# Patient Record
Sex: Male | Born: 1945 | Race: Black or African American | Hispanic: No | Marital: Single | Smoking: Former smoker
Health system: Southern US, Community
[De-identification: ages and names within clinical notes are randomized; demographics above are authoritative.]

## PROBLEM LIST (undated history)

## (undated) DIAGNOSIS — I10 Essential (primary) hypertension: Secondary | ICD-10-CM

## (undated) DIAGNOSIS — K219 Gastro-esophageal reflux disease without esophagitis: Secondary | ICD-10-CM

## (undated) HISTORY — PX: JOINT REPLACEMENT: SHX530

---

## 2010-11-24 DIAGNOSIS — Z860100 Personal history of colon polyps, unspecified: Secondary | ICD-10-CM | POA: Insufficient documentation

## 2010-11-24 DIAGNOSIS — I10 Essential (primary) hypertension: Secondary | ICD-10-CM

## 2010-11-24 DIAGNOSIS — M179 Osteoarthritis of knee, unspecified: Secondary | ICD-10-CM

## 2010-11-24 DIAGNOSIS — R1310 Dysphagia, unspecified: Secondary | ICD-10-CM

## 2010-11-24 HISTORY — DX: Personal history of colon polyps, unspecified: Z86.0100

## 2010-11-24 HISTORY — DX: Dysphagia, unspecified: R13.10

## 2010-11-24 HISTORY — DX: Essential (primary) hypertension: I10

## 2010-11-24 HISTORY — DX: Osteoarthritis of knee, unspecified: M17.9

## 2010-12-31 DIAGNOSIS — Z8711 Personal history of peptic ulcer disease: Secondary | ICD-10-CM

## 2010-12-31 DIAGNOSIS — M66369 Spontaneous rupture of flexor tendons, unspecified lower leg: Secondary | ICD-10-CM

## 2010-12-31 DIAGNOSIS — F3342 Major depressive disorder, recurrent, in full remission: Secondary | ICD-10-CM | POA: Insufficient documentation

## 2010-12-31 DIAGNOSIS — I519 Heart disease, unspecified: Secondary | ICD-10-CM

## 2010-12-31 DIAGNOSIS — K222 Esophageal obstruction: Secondary | ICD-10-CM

## 2010-12-31 DIAGNOSIS — M19012 Primary osteoarthritis, left shoulder: Secondary | ICD-10-CM

## 2010-12-31 DIAGNOSIS — Z96659 Presence of unspecified artificial knee joint: Secondary | ICD-10-CM | POA: Insufficient documentation

## 2010-12-31 DIAGNOSIS — M4120 Other idiopathic scoliosis, site unspecified: Secondary | ICD-10-CM | POA: Insufficient documentation

## 2010-12-31 DIAGNOSIS — M47817 Spondylosis without myelopathy or radiculopathy, lumbosacral region: Secondary | ICD-10-CM | POA: Insufficient documentation

## 2010-12-31 DIAGNOSIS — B360 Pityriasis versicolor: Secondary | ICD-10-CM

## 2010-12-31 HISTORY — DX: Pityriasis versicolor: B36.0

## 2010-12-31 HISTORY — DX: Primary osteoarthritis, left shoulder: M19.012

## 2010-12-31 HISTORY — DX: Heart disease, unspecified: I51.9

## 2010-12-31 HISTORY — DX: Spondylosis without myelopathy or radiculopathy, lumbosacral region: M47.817

## 2010-12-31 HISTORY — DX: Other idiopathic scoliosis, site unspecified: M41.20

## 2010-12-31 HISTORY — DX: Spontaneous rupture of flexor tendons, unspecified lower leg: M66.369

## 2010-12-31 HISTORY — DX: Esophageal obstruction: K22.2

## 2010-12-31 HISTORY — DX: Major depressive disorder, recurrent, in full remission: F33.42

## 2010-12-31 HISTORY — DX: Presence of unspecified artificial knee joint: Z96.659

## 2010-12-31 HISTORY — DX: Personal history of peptic ulcer disease: Z87.11

## 2011-07-07 ENCOUNTER — Emergency Department (HOSPITAL_COMMUNITY): Payer: Medicare PPO

## 2011-07-07 ENCOUNTER — Encounter (HOSPITAL_COMMUNITY): Payer: Self-pay | Admitting: *Deleted

## 2011-07-07 ENCOUNTER — Emergency Department (HOSPITAL_COMMUNITY)
Admission: EM | Admit: 2011-07-07 | Discharge: 2011-07-07 | Disposition: A | Discharge status: Home / Self Care | Payer: Medicare PPO | Attending: Emergency Medicine | Admitting: Emergency Medicine

## 2011-07-07 DIAGNOSIS — N200 Calculus of kidney: Secondary | ICD-10-CM | POA: Insufficient documentation

## 2011-07-07 DIAGNOSIS — R109 Unspecified abdominal pain: Secondary | ICD-10-CM | POA: Insufficient documentation

## 2011-07-07 DIAGNOSIS — I10 Essential (primary) hypertension: Secondary | ICD-10-CM | POA: Insufficient documentation

## 2011-07-07 DIAGNOSIS — K219 Gastro-esophageal reflux disease without esophagitis: Secondary | ICD-10-CM | POA: Insufficient documentation

## 2011-07-07 DIAGNOSIS — E119 Type 2 diabetes mellitus without complications: Secondary | ICD-10-CM | POA: Insufficient documentation

## 2011-07-07 HISTORY — DX: Essential (primary) hypertension: I10

## 2011-07-07 HISTORY — DX: Gastro-esophageal reflux disease without esophagitis: K21.9

## 2011-07-07 LAB — CBC WITH DIFFERENTIAL/PLATELET
Eosinophils Relative: 0 % (ref 0–5)
HCT: 39.4 % (ref 39.0–52.0)
Lymphocytes Relative: 7 % — ABNORMAL LOW (ref 12–46)
Lymphs Abs: 0.9 10*3/uL (ref 0.7–4.0)
MCV: 93.1 fL (ref 78.0–100.0)
Monocytes Absolute: 0.4 10*3/uL (ref 0.1–1.0)
RBC: 4.23 MIL/uL (ref 4.22–5.81)
WBC: 13.2 10*3/uL — ABNORMAL HIGH (ref 4.0–10.5)

## 2011-07-07 LAB — COMPREHENSIVE METABOLIC PANEL
CO2: 26 mEq/L (ref 19–32)
Calcium: 8.5 mg/dL (ref 8.4–10.5)
Creatinine, Ser: 1.22 mg/dL (ref 0.50–1.35)
GFR calc Af Amer: 69 mL/min — ABNORMAL LOW (ref >90)
GFR calc non Af Amer: 60 mL/min — ABNORMAL LOW (ref >90)
Glucose, Bld: 132 mg/dL — ABNORMAL HIGH (ref 70–99)

## 2011-07-07 LAB — URINE MICROSCOPIC-ADD ON

## 2011-07-07 LAB — URINALYSIS, ROUTINE W REFLEX MICROSCOPIC
Hgb urine dipstick: NEGATIVE
Protein, ur: NEGATIVE mg/dL
Urobilinogen, UA: 1 mg/dL (ref 0.0–1.0)

## 2011-07-07 LAB — TROPONIN I
Troponin I: <0.3 ng/mL (ref <0.30)
Troponin I: <0.3 ng/mL (ref <0.30)

## 2011-07-07 MED ORDER — ONDANSETRON HCL 4 MG/2ML IJ SOLN
4.0000 mg | Freq: Once | INTRAMUSCULAR | Status: AC
  Administered 2011-07-07: 10:00:00 via INTRAVENOUS

## 2011-07-07 MED ORDER — ONDANSETRON HCL 4 MG/2ML IJ SOLN
INTRAMUSCULAR | Status: AC
  Filled 2011-07-07: qty 2

## 2011-07-07 MED ORDER — SODIUM CHLORIDE 0.9 % IV BOLUS (SEPSIS)
1000.0000 mL | Freq: Once | INTRAVENOUS | Status: AC
  Administered 2011-07-07: 1000 mL via INTRAVENOUS

## 2011-07-07 MED ORDER — TRAMADOL HCL 50 MG PO TABS: 50.0000 mg | ORAL_TABLET | Freq: Four times a day (QID) | ORAL | Status: AC | PRN

## 2011-07-07 NOTE — ED Notes (Signed)
Patient returned from CT

## 2011-07-07 NOTE — ED Notes (Signed)
Pt in by ems from home. C/o sudden onset lower abd pain bilaterally. Reports urge to urinate but very little output. Fentanyl given en route, pain down from 8 to 0/10. 4mg  Zofran given as well. Denies n/v/d. 20g L hand. infused en route.

## 2011-07-07 NOTE — ED Notes (Signed)
Pt reports loading luggage into his car this am and having a sudden onset of lower abd pain and diaphoresis. Went to lie down, diaphoresis resolved. Reports when he tries to urinate "nothing comes out." No urge to urinate. BM this am, loose.

## 2011-07-07 NOTE — ED Notes (Signed)
ZOX:[id number]<AV> Expected date:<BR> Expected time:<BR> Means of arrival:<BR> Comments:<BR> EMS Kidney stone

## 2011-07-07 NOTE — ED Provider Notes (Signed)
Patient relates this morning he was loading luggage into his car to leave to go to attend his son's wedding which is tomorrow on the [address] of [place]. He relates acute onset of lower abdominal pain and indicates below his umbilicus with severe sweating that lasted at least 45 minutes before he called EMS. He relates he's never had this before. He states earlier today he felt like his food was sort of hanging up when he was eating however that has resolved. Patient was given fentanyl by EMS and his pain has been 0 during his ED visit.  Patient's PCP is at [facility].  Patient is alert and cooperative he is in no distress. He is laughing with his family. His abdomen soft without pain to palpation there are no masses felt.   Medical screening examination/treatment/procedure(s) were conducted as a shared visit with non-physician practitioner(s) and myself.  I personally evaluated the patient during the encounter  Devoria Albe, MD, Franz Dell, MD 07/07/11 [phone]

## 2011-07-07 NOTE — ED Provider Notes (Addendum)
History     CSN: [id number]  Arrival date & time 07/07/11  4098   First MD Initiated Contact with Patient 07/07/11 1000      Chief Complaint  Patient presents with  . Nephrolithiasis    (Consider location/radiation/quality/duration/timing/severity/associated sxs/prior treatment) HPI  66 year old male with history of hypertension history of diabetes presents complaining of abdominal pain. Patient states this morning while he was picking up some luggage when he noticed a sharp throbbing pain to his mid abdomen. The onset was acute, persistent and lasting for nearly 2 hours. Pain is nonradiating and associated with some nausea without vomiting, and diaphoresis. Pain improves after receiving of fentanyl given by EMS. Patient states pain goes from an 8/10 to 0/10 after the medication.  Patient denies any associated fever, chills, chest pain, shortness of breath, back pain. He denies any urinary symptoms although nursing documentation states that he was having urge to urinate.  Patient denies any prior abdominal surgery. His last bowel movement was this morning with some loose stool. He reports his last colonoscopy was yesterday and was unremarkable. He denies any blood per rectum.  Denies history of hernia.    Past Medical History  Diagnosis Date  . Diabetes mellitus   . Hypertension   . GERD (gastroesophageal reflux disease)     No past surgical history on file.  No family history on file.  History  Substance Use Topics  . Smoking status: Not on file  . Smokeless tobacco: Not on file  . Alcohol Use:       Review of Systems  All other systems reviewed and are negative.    Allergies  Review of patient's allergies indicates no known allergies.  Home Medications  No current outpatient prescriptions on file.  There were no vitals taken for this visit.  Physical Exam  Nursing note and vitals reviewed. Constitutional: He appears well-developed and well-nourished. No  distress.       Awake, alert, nontoxic appearance  HENT:  Head: Atraumatic.  Eyes: Conjunctivae are normal. Right eye exhibits no discharge. Left eye exhibits no discharge.  Neck: Normal range of motion. Neck supple.  Cardiovascular: Normal rate and regular rhythm.   Pulmonary/Chest: Effort normal. No respiratory distress. He exhibits no tenderness.  Abdominal: Soft. There is tenderness in the left lower quadrant. There is no rigidity, no rebound, no guarding, no CVA tenderness, no tenderness at McBurney's point and negative Murphy's sign. No hernia. Hernia confirmed negative in the ventral area.       Mild tenderness to left lower quadrant without guarding or rebound tenderness. No Murphy sign. Negative McBurney's point. No evidence of hernia noted. No overlying skin changes.  Musculoskeletal: He exhibits no edema and no tenderness.       ROM appears intact, no obvious focal weakness  Neurological: He is alert.  Skin: Skin is warm and dry. No rash noted.  Psychiatric: He has a normal mood and affect.    ED Course  Procedures (including critical care time)  Labs Reviewed - No data to display No results found.   No diagnosis found.   Date: 07/07/2011  Rate: 82  Rhythm: normal sinus rhythm  QRS Axis: normal  Intervals: normal  ST/T Wave abnormalities: normal  Conduction Disutrbances: none  Narrative Interpretation:   Old EKG Reviewed: no old ecg for comparison  Results for orders placed during the hospital encounter of 07/07/11  CBC WITH DIFFERENTIAL      Component Value Range   WBC 13.2 (*)  4.0 - 10.5 K/uL   RBC 4.23  4.22 - 5.81 MIL/uL   Hemoglobin 13.6  13.0 - 17.0 g/dL   HCT 45.4  09.8 - 11.9 %   MCV 93.1  78.0 - 100.0 fL   MCH 32.2  26.0 - 34.0 pg   MCHC 34.5  30.0 - 36.0 g/dL   RDW 14.7  82.9 - 56.2 %   Platelets 165  150 - 400 K/uL   Neutrophils Relative 90 (*) 43 - 77 %   Neutro Abs 11.8 (*) 1.7 - 7.7 K/uL   Lymphocytes Relative 7 (*) 12 - 46 %   Lymphs Abs  0.9  0.7 - 4.0 K/uL   Monocytes Relative 3  3 - 12 %   Monocytes Absolute 0.4  0.1 - 1.0 K/uL   Eosinophils Relative 0  0 - 5 %   Eosinophils Absolute 0.1  0.0 - 0.7 K/uL   Basophils Relative 0  0 - 1 %   Basophils Absolute 0.0  0.0 - 0.1 K/uL  COMPREHENSIVE METABOLIC PANEL      Component Value Range   Sodium 138  135 - 145 mEq/L   Potassium 3.5  3.5 - 5.1 mEq/L   Chloride 103  96 - 112 mEq/L   CO2 26  19 - 32 mEq/L   Glucose, Bld 132 (*) 70 - 99 mg/dL   BUN 27 (*) 6 - 23 mg/dL   Creatinine, Ser 1.30  0.50 - 1.35 mg/dL   Calcium 8.5  8.4 - 86.5 mg/dL   Total Protein 6.4  6.0 - 8.3 g/dL   Albumin 3.6  3.5 - 5.2 g/dL   AST 22  0 - 37 U/L   ALT 12  0 - 53 U/L   Alkaline Phosphatase 49  39 - 117 U/L   Total Bilirubin 0.9  0.3 - 1.2 mg/dL   GFR calc non Af Amer 60 (*) >90 mL/min   GFR calc Af Amer 69 (*) >90 mL/min  LIPASE, BLOOD      Component Value Range   Lipase 22  11 - 59 U/L  URINALYSIS, ROUTINE W REFLEX MICROSCOPIC      Component Value Range   Color, Urine AMBER (*) YELLOW   APPearance CLOUDY (*) CLEAR   Specific Gravity, Urine 1.026  1.005 - 1.030   pH 6.0  5.0 - 8.0   Glucose, UA NEGATIVE  NEGATIVE mg/dL   Hgb urine dipstick NEGATIVE  NEGATIVE   Bilirubin Urine NEGATIVE  NEGATIVE   Ketones, ur NEGATIVE  NEGATIVE mg/dL   Protein, ur NEGATIVE  NEGATIVE mg/dL   Urobilinogen, UA 1.0  0.0 - 1.0 mg/dL   Nitrite NEGATIVE  NEGATIVE   Leukocytes, UA TRACE (*) NEGATIVE  TROPONIN I      Component Value Range   Troponin I <0.30  <0.30 ng/mL  URINE MICROSCOPIC-ADD ON      Component Value Range   Squamous Epithelial / LPF FEW (*) RARE   WBC, UA 0-2  <3 WBC/hpf   Bacteria, UA FEW (*) RARE   Urine-Other MUCOUS PRESENT     Ct Abdomen Pelvis Wo Contrast  07/07/2011  *RADIOLOGY REPORT*  Clinical Data: Abdominal pain.  Rule out kidney stone or aneurysm  CT ABDOMEN AND PELVIS WITHOUT CONTRAST  Technique:  Multidetector CT imaging of the abdomen and pelvis was performed following  the standard protocol without intravenous contrast.  Comparison: None.  Findings: Tiny 1 mm nonobstructing renal calculi on the left.  3 cm cyst  on the right kidney.  No hydronephrosis.  No renal calculi on the right.  Lung bases are clear.  Liver and gallbladder normal.  Multiple calcified granulomata in the spleen which is not enlarged. Pancreas is normal.  Negative for bowel obstruction or bowel thickening.  Appendix is normal.  No mass or adenopathy.  No free fluid.  Prostate is enlarged.  Hydrocele in the right hemi scrotum.  Minimal calcification in the abdominal aorta without aneurysm.  No iliac artery aneurysm.  IMPRESSION: Tiny nonobstructing calculi left kidney.  No acute abnormality.  Negative for abdominal aortic aneurysm.  Original Report Authenticated By: Camelia Phenes, M.D.       MDM  Mid abd pain which has resolved after receiving Fentanyl .  Work up initiated.    11:51 AM An abdominal and pelvis CT scan was performed showing a tiny 1 mm nonobstructing renal calculi on the left without any other acute finding. No evidence of hydronephrosis. Patient is currently symptom free. Urine shows a few bacteria. Urine culture sent. Mild leukocytosis of 13.2. BUN mildly elevated at 27. No other significant finding. No evidence of abd aortic aneurysm.    Patient episodic abdominal pain may not related to his nonobstructive kidney stone.  Will give pain medication to use PRN, soft diet, avoid heavy lifting x 24hrs.  Pt is currently pain free and request to be discharge.     Fayrene Helper, PA-C 07/09/11 1859

## 2011-07-07 NOTE — ED Provider Notes (Signed)
See prior note   Ward Givens, MD 07/07/11 1203

## 2011-07-07 NOTE — Discharge Instructions (Signed)
You were seen for evaluation of your abdominal pain. Your CT scan shows a small 1 mm nonobstructing kidney stone to your left kidney. This is unlikely to be related to your pain. If the pain returned please take pain medication as prescribed. Please eat a light diet, no heavy lifting for the next few days.  Return if you developed fever, chills, increasing pain, or any other concerning symptoms.  Abdominal Pain Abdominal pain can be caused by many things. Your caregiver decides the seriousness of your pain by an examination and possibly blood tests and X-rays. Many cases can be observed and treated at home. Most abdominal pain is not caused by a disease and will probably improve without treatment. However, in many cases, more time must pass before a clear cause of the pain can be found. Before that point, it may not be known if you need more testing, or if hospitalization or surgery is needed. HOME CARE INSTRUCTIONS   Do not take laxatives unless directed by your caregiver.   Take pain medicine only as directed by your caregiver.   Only take over-the-counter or prescription medicines for pain, discomfort, or fever as directed by your caregiver.   Try a clear liquid diet (broth, tea, or water) for as long as directed by your caregiver. Slowly move to a bland diet as tolerated.  SEEK IMMEDIATE MEDICAL CARE IF:   The pain does not go away.   You have a fever.   You keep throwing up (vomiting).   The pain is felt only in portions of the abdomen. Pain in the right side could possibly be appendicitis. In an adult, pain in the left lower portion of the abdomen could be colitis or diverticulitis.   You pass bloody or black tarry stools.  MAKE SURE YOU:   Understand these instructions.   Will watch your condition.   Will get help right away if you are not doing well or get worse.  Document Released: 10/05/2004 Document Revised: 12/15/2010 Document Reviewed: 08/14/2007 [facility] Patient  Information 2012 [place], [place].

## 2011-07-07 NOTE — ED Notes (Signed)
Patient transported to CT 

## 2011-07-09 LAB — URINE CULTURE: Colony Count: NO GROWTH

## 2011-07-09 NOTE — ED Provider Notes (Signed)
See prior note   Ward Givens, MD 07/09/11 2023

## 2012-01-11 DIAGNOSIS — N4289 Other specified disorders of prostate: Secondary | ICD-10-CM

## 2012-01-11 DIAGNOSIS — M25552 Pain in left hip: Secondary | ICD-10-CM

## 2012-01-11 HISTORY — DX: Pain in left hip: M25.552

## 2012-01-11 HISTORY — DX: Other specified disorders of prostate: N42.89

## 2012-01-23 DIAGNOSIS — R972 Elevated prostate specific antigen [PSA]: Secondary | ICD-10-CM

## 2012-01-23 HISTORY — DX: Elevated prostate specific antigen (PSA): R97.20

## 2012-08-09 DIAGNOSIS — N451 Epididymitis: Secondary | ICD-10-CM | POA: Insufficient documentation

## 2012-08-09 DIAGNOSIS — N50812 Left testicular pain: Secondary | ICD-10-CM

## 2012-08-09 DIAGNOSIS — N419 Inflammatory disease of prostate, unspecified: Secondary | ICD-10-CM

## 2012-08-09 HISTORY — DX: Epididymitis: N45.1

## 2012-08-09 HISTORY — DX: Inflammatory disease of prostate, unspecified: N41.9

## 2012-08-09 HISTORY — DX: Left testicular pain: N50.812

## 2013-07-01 IMAGING — CT CT ABD-PELV W/O CM
1 of 2 series · 15 of 32 positions shown, 19 images · non-contrast
Comparison: None.

CLINICAL DATA: Abdominal pain.  Rule out kidney stone or aneurysm

CT ABDOMEN AND PELVIS WITHOUT CONTRAST
TECHNIQUE: Multidetector CT imaging of the abdomen and pelvis was
performed following the standard protocol without intravenous
contrast.

[Series 2: abd/pel w/o · axial · non-contrast · 0.67mm/px · z∈[-628,-243]mm · 15 of 85 slices shown, 19 images]
[im 4/85  soft-tissue]
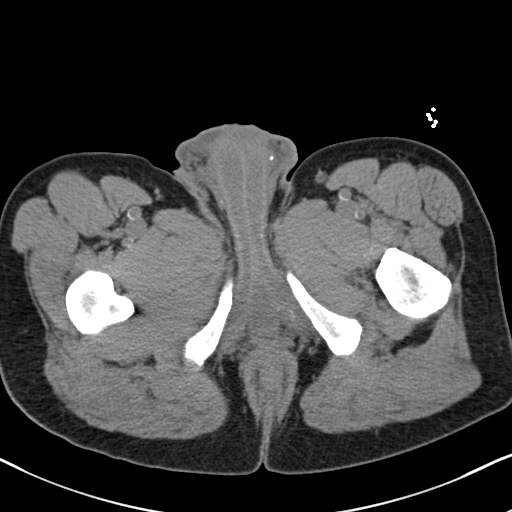
[im 4/85  bone]
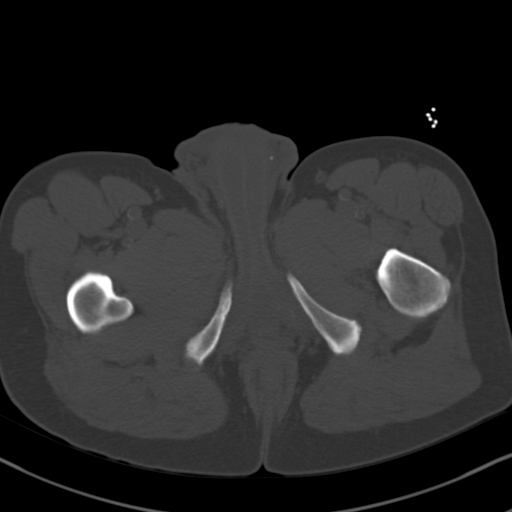
[im 11/85  soft-tissue]
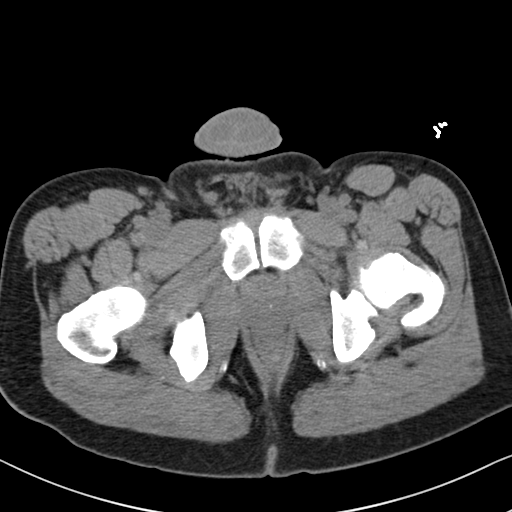
[im 18/85  soft-tissue]
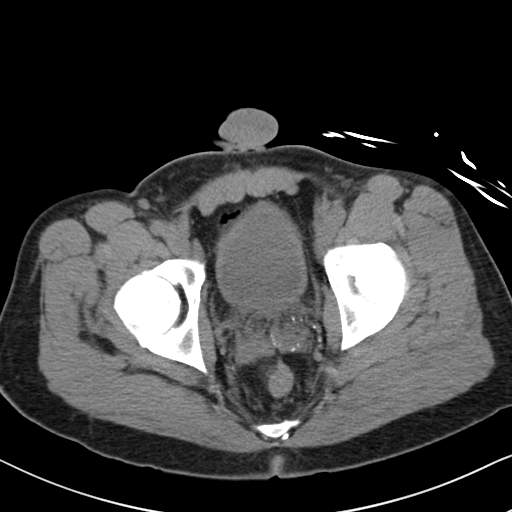
[im 25/85  soft-tissue]
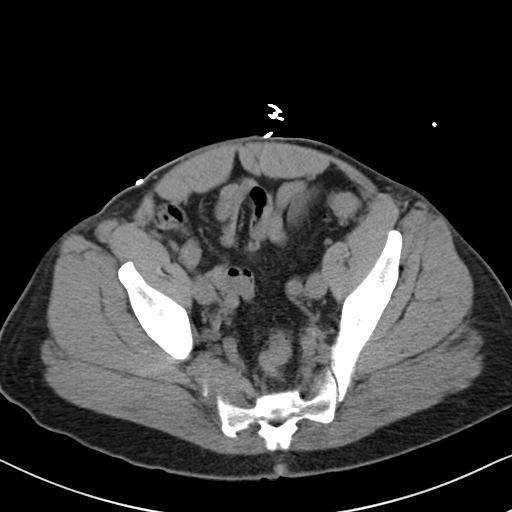
[im 29/85  soft-tissue]
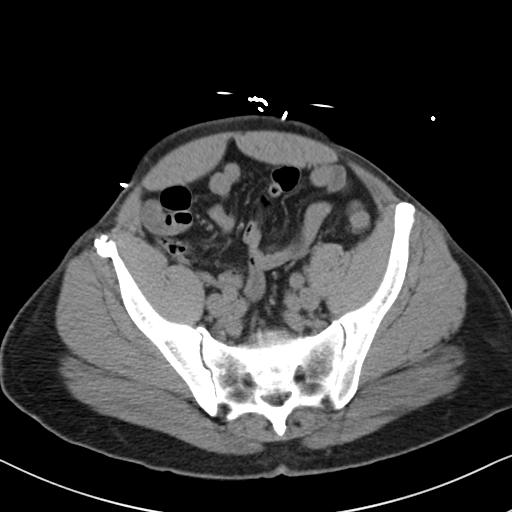
[im 36/85  soft-tissue]
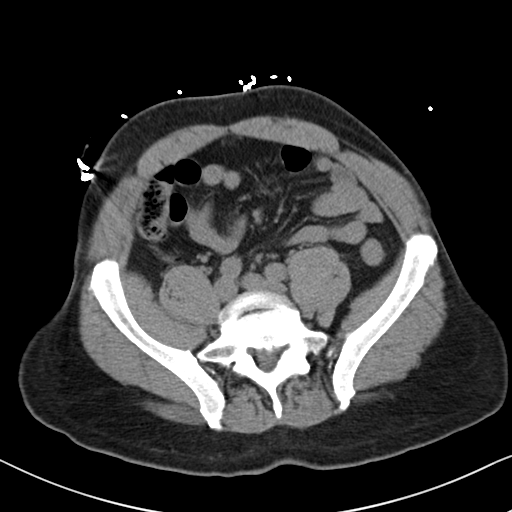
[im 43/85  soft-tissue]
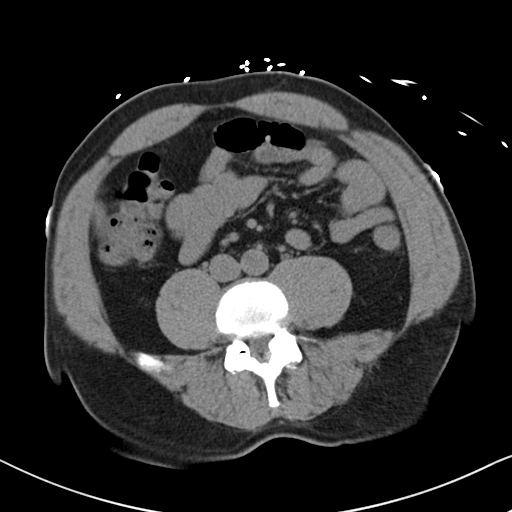
[im 50/85  soft-tissue]
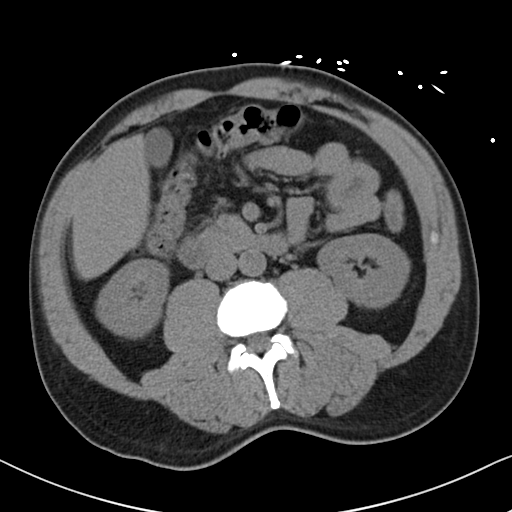
[im 57/85  soft-tissue]
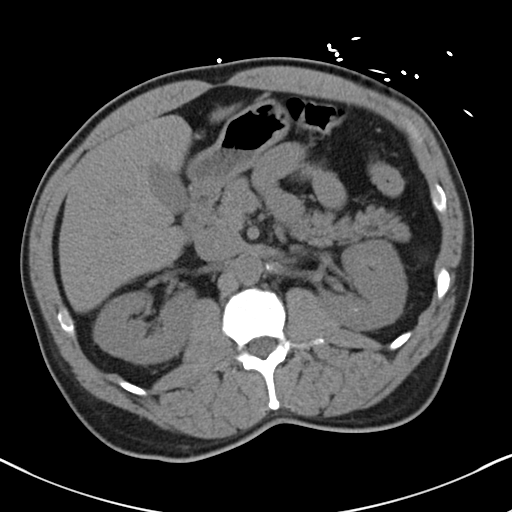
[im 57/85  bone]
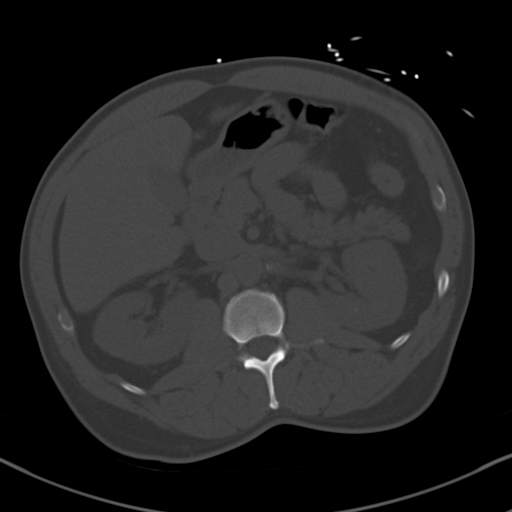
[im 60/85  soft-tissue]
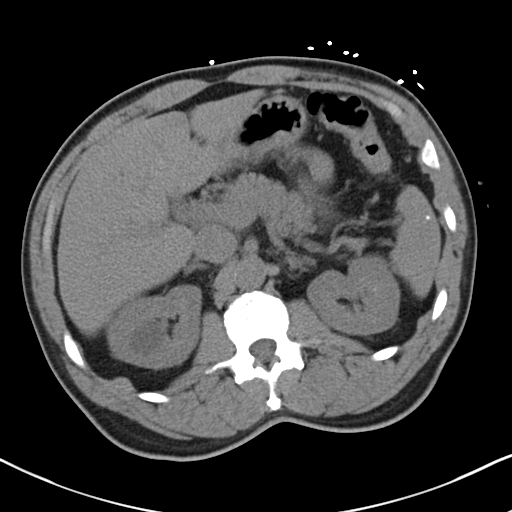
[im 67/85  soft-tissue]
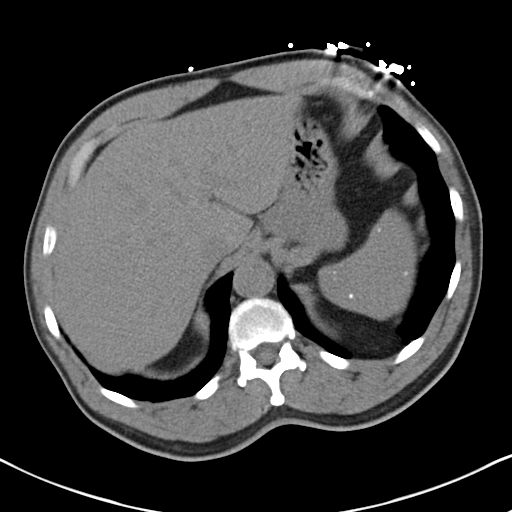
[im 71/85  lung]
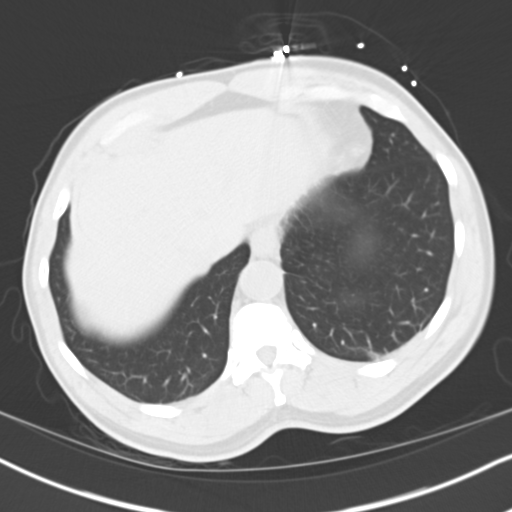
[im 74/85  soft-tissue]
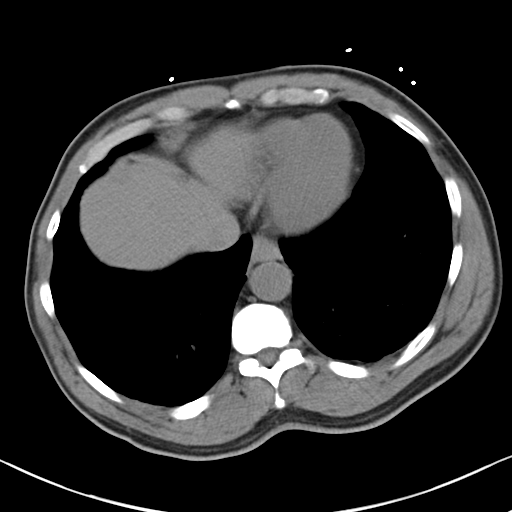
[im 74/85  lung]
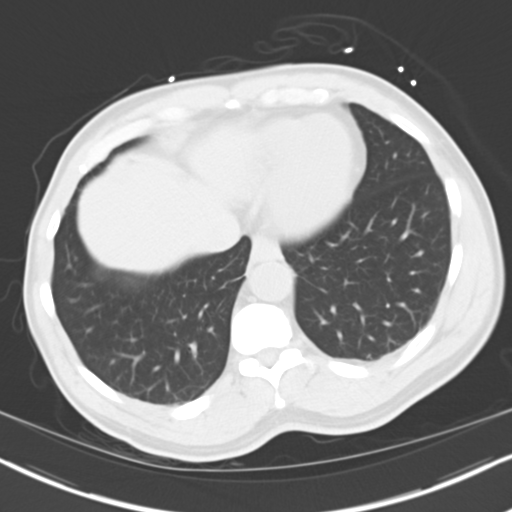
[im 78/85  lung]
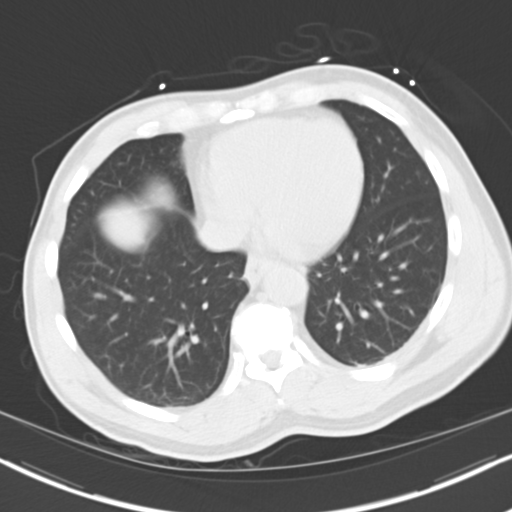
[im 81/85  soft-tissue]
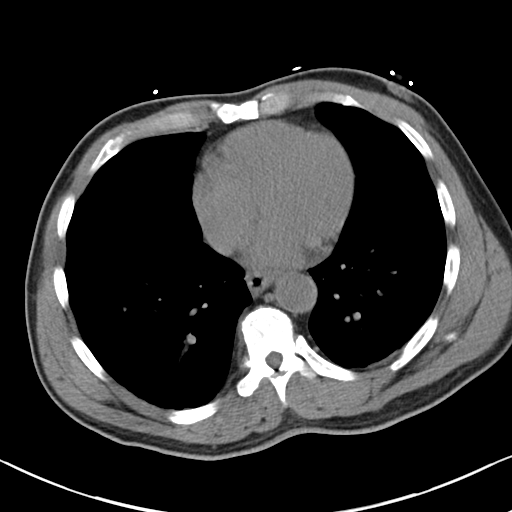
[im 81/85  lung]
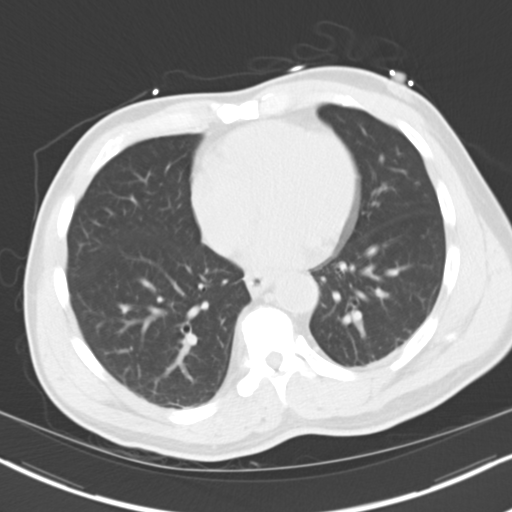

[15 of 32 positions shown; findings below may reference images not displayed]

FINDINGS: Tiny 1 mm nonobstructing renal calculi on the left.  3 cm
cyst on the right kidney.  No hydronephrosis.  No renal calculi on
the right.

Lung bases are clear.  Liver and gallbladder normal.  Multiple
calcified granulomata in the spleen which is not enlarged.
Pancreas is normal.

Negative for bowel obstruction or bowel thickening.  Appendix is
normal.  No mass or adenopathy.  No free fluid.  Prostate is
enlarged.  Hydrocele in the right hemi scrotum.

Minimal calcification in the abdominal aorta without aneurysm.  No
iliac artery aneurysm.
IMPRESSION: Tiny nonobstructing calculi left kidney.

No acute abnormality.

Negative for abdominal aortic aneurysm.

## 2013-11-10 DIAGNOSIS — F1090 Alcohol use, unspecified, uncomplicated: Secondary | ICD-10-CM

## 2013-11-10 DIAGNOSIS — Z789 Other specified health status: Secondary | ICD-10-CM | POA: Insufficient documentation

## 2013-11-10 HISTORY — DX: Alcohol use, unspecified, uncomplicated: F10.90

## 2016-07-13 DIAGNOSIS — H209 Unspecified iridocyclitis: Secondary | ICD-10-CM | POA: Insufficient documentation

## 2016-07-13 DIAGNOSIS — Z961 Presence of intraocular lens: Secondary | ICD-10-CM | POA: Insufficient documentation

## 2016-07-13 DIAGNOSIS — H25811 Combined forms of age-related cataract, right eye: Secondary | ICD-10-CM

## 2016-07-13 DIAGNOSIS — H04123 Dry eye syndrome of bilateral lacrimal glands: Secondary | ICD-10-CM

## 2016-07-13 DIAGNOSIS — H35033 Hypertensive retinopathy, bilateral: Secondary | ICD-10-CM

## 2016-07-13 HISTORY — DX: Unspecified iridocyclitis: H20.9

## 2016-07-13 HISTORY — DX: Presence of intraocular lens: Z96.1

## 2016-07-13 HISTORY — DX: Dry eye syndrome of bilateral lacrimal glands: H04.123

## 2016-07-13 HISTORY — DX: Combined forms of age-related cataract, right eye: H25.811

## 2016-07-13 HISTORY — DX: Hypertensive retinopathy, bilateral: H35.033

## 2016-08-15 DIAGNOSIS — M1612 Unilateral primary osteoarthritis, left hip: Secondary | ICD-10-CM

## 2016-08-15 HISTORY — DX: Unilateral primary osteoarthritis, left hip: M16.12

## 2016-08-31 DIAGNOSIS — M19011 Primary osteoarthritis, right shoulder: Secondary | ICD-10-CM

## 2016-08-31 HISTORY — DX: Primary osteoarthritis, right shoulder: M19.011

## 2017-07-13 DIAGNOSIS — K624 Stenosis of anus and rectum: Secondary | ICD-10-CM

## 2017-07-13 HISTORY — DX: Stenosis of anus and rectum: K62.4

## 2018-02-06 DIAGNOSIS — C61 Malignant neoplasm of prostate: Secondary | ICD-10-CM

## 2018-02-06 HISTORY — DX: Malignant neoplasm of prostate: C61

## 2018-08-05 DIAGNOSIS — R55 Syncope and collapse: Secondary | ICD-10-CM

## 2018-08-05 HISTORY — DX: Syncope and collapse: R55

## 2019-02-19 DIAGNOSIS — Z720 Tobacco use: Secondary | ICD-10-CM

## 2019-02-19 DIAGNOSIS — Z8546 Personal history of malignant neoplasm of prostate: Secondary | ICD-10-CM

## 2019-02-19 DIAGNOSIS — E1165 Type 2 diabetes mellitus with hyperglycemia: Secondary | ICD-10-CM

## 2019-02-19 HISTORY — DX: Type 2 diabetes mellitus with hyperglycemia: E11.65

## 2019-02-19 HISTORY — DX: Personal history of malignant neoplasm of prostate: Z85.46

## 2019-02-19 HISTORY — DX: Tobacco use: Z72.0

## 2021-06-14 DIAGNOSIS — R809 Proteinuria, unspecified: Secondary | ICD-10-CM | POA: Insufficient documentation

## 2021-06-14 HISTORY — DX: Proteinuria, unspecified: R80.9

## 2021-09-15 DIAGNOSIS — N5239 Other post-surgical erectile dysfunction: Secondary | ICD-10-CM

## 2021-09-15 HISTORY — DX: Other and unspecified postprocedural erectile dysfunction: N52.39

## 2021-11-13 ENCOUNTER — Encounter (HOSPITAL_BASED_OUTPATIENT_CLINIC_OR_DEPARTMENT_OTHER): Payer: Self-pay | Admitting: Emergency Medicine

## 2021-11-13 ENCOUNTER — Emergency Department (HOSPITAL_BASED_OUTPATIENT_CLINIC_OR_DEPARTMENT_OTHER): Payer: Medicare HMO

## 2021-11-13 ENCOUNTER — Other Ambulatory Visit: Payer: Self-pay

## 2021-11-13 ENCOUNTER — Emergency Department (HOSPITAL_BASED_OUTPATIENT_CLINIC_OR_DEPARTMENT_OTHER)
Admission: EM | Admit: 2021-11-13 | Discharge: 2021-11-13 | Disposition: A | Discharge status: Home / Self Care | Payer: Medicare HMO | Attending: Emergency Medicine | Admitting: Emergency Medicine

## 2021-11-13 DIAGNOSIS — I10 Essential (primary) hypertension: Secondary | ICD-10-CM | POA: Insufficient documentation

## 2021-11-13 DIAGNOSIS — R079 Chest pain, unspecified: Secondary | ICD-10-CM

## 2021-11-13 DIAGNOSIS — R42 Dizziness and giddiness: Secondary | ICD-10-CM | POA: Insufficient documentation

## 2021-11-13 DIAGNOSIS — R0789 Other chest pain: Secondary | ICD-10-CM | POA: Insufficient documentation

## 2021-11-13 DIAGNOSIS — E119 Type 2 diabetes mellitus without complications: Secondary | ICD-10-CM | POA: Insufficient documentation

## 2021-11-13 LAB — CBC
HCT: 42.6 % (ref 39.0–52.0)
Hemoglobin: 14.8 g/dL (ref 13.0–17.0)
MCH: 31.6 pg (ref 26.0–34.0)
MCHC: 34.7 g/dL (ref 30.0–36.0)
MCV: 91 fL (ref 80.0–100.0)
Platelets: 194 10*3/uL (ref 150–400)
RBC: 4.68 MIL/uL (ref 4.22–5.81)
RDW: 11.9 % (ref 11.5–15.5)
WBC: 5.7 10*3/uL (ref 4.0–10.5)
nRBC: 0 % (ref 0.0–0.2)

## 2021-11-13 LAB — BASIC METABOLIC PANEL
Anion gap: 8 (ref 5–15)
BUN: 16 mg/dL (ref 8–23)
CO2: 26 mmol/L (ref 22–32)
Calcium: 8.6 mg/dL — ABNORMAL LOW (ref 8.9–10.3)
Chloride: 105 mmol/L (ref 98–111)
Creatinine, Ser: 1.01 mg/dL (ref 0.61–1.24)
GFR, Estimated: >60 mL/min (ref >60)
Glucose, Bld: 132 mg/dL — ABNORMAL HIGH (ref 70–99)
Potassium: 3.5 mmol/L (ref 3.5–5.1)
Sodium: 139 mmol/L (ref 135–145)

## 2021-11-13 LAB — TROPONIN I (HIGH SENSITIVITY): Troponin I (High Sensitivity): 4 ng/L (ref <18)

## 2021-11-13 MED ORDER — ACETAMINOPHEN 500 MG PO TABS
1000.0000 mg | ORAL_TABLET | Freq: Once | ORAL | Status: AC
  Administered 2021-11-13: 1000 mg via ORAL
  Filled 2021-11-13: qty 2

## 2021-11-13 MED ORDER — FAMOTIDINE 20 MG PO TABS
20.0000 mg | ORAL_TABLET | Freq: Once | ORAL | Status: AC
  Administered 2021-11-13: 20 mg via ORAL
  Filled 2021-11-13: qty 1

## 2021-11-13 MED ORDER — ALUM & MAG HYDROXIDE-SIMETH 200-200-20 MG/5ML PO SUSP
30.0000 mL | Freq: Once | ORAL | Status: AC
  Administered 2021-11-13: 30 mL via ORAL
  Filled 2021-11-13: qty 30

## 2021-11-13 NOTE — ED Triage Notes (Signed)
Pt arrives pov, steady gait, c/o LT side non radiating CP that started last night, denies nausea, dizziness today. PT AOx4

## 2021-11-13 NOTE — ED Provider Notes (Signed)
[facility] Provider Note   CSN: [id number] Arrival date & time: 11/13/21  1855     History Chief Complaint  Patient presents with   Chest Pain    HPI Joshua Hood is a 76 y.o. male presenting for chief complaint of chest pain.  He endorses multiple episodes of chest pain over the last week.  Last episode was this morning while he was at church.  It was left-sided without radiation.  He endorses some intermittent dizziness to accompany it.  His symptoms have completely resolved tonight.  He denies fevers or chills, nausea vomiting compassing shortness of breath.  He endorses a history of esophageal disease and states that he feels similar to that.  Denies any cardiac disease at baseline.  No family history of cardiac disease.  He is otherwise ambulatory tolerating p.o. intake at this time.  No medications prior to arrival.  History of hypertension/DM.   Patient's recorded medical, surgical, social, medication list and allergies were reviewed in the Snapshot window as part of the initial history.   Review of Systems   Review of Systems  Constitutional:  Negative for chills and fever.  HENT:  Negative for ear pain and sore throat.   Eyes:  Negative for pain and visual disturbance.  Respiratory:  Negative for cough and shortness of breath.   Cardiovascular:  Positive for chest pain. Negative for palpitations.  Gastrointestinal:  Negative for abdominal pain and vomiting.  Genitourinary:  Negative for dysuria and hematuria.  Musculoskeletal:  Negative for arthralgias and back pain.  Skin:  Negative for color change and rash.  Neurological:  Negative for seizures and syncope.  All other systems reviewed and are negative.   Physical Exam Updated Vital Signs BP 132/78   Pulse 87   Temp 98 F (36.7 C) (Oral)   Resp (!) 21   Ht '5\' 5"'$  (1.651 m)   Wt 86.2 kg   SpO2 91%   BMI 31.62 kg/m  Physical Exam Vitals and nursing note reviewed.   Constitutional:      General: He is not in acute distress.    Appearance: He is well-developed.  HENT:     Head: Normocephalic and atraumatic.  Eyes:     Conjunctiva/sclera: Conjunctivae normal.  Cardiovascular:     Rate and Rhythm: Normal rate and regular rhythm.     Heart sounds: No murmur heard. Pulmonary:     Effort: Pulmonary effort is normal. No respiratory distress.     Breath sounds: Normal breath sounds.  Abdominal:     Palpations: Abdomen is soft.     Tenderness: There is no abdominal tenderness.  Musculoskeletal:        General: No swelling.     Cervical back: Neck supple.  Skin:    General: Skin is warm and dry.     Capillary Refill: Capillary refill takes less than 2 seconds.  Neurological:     Mental Status: He is alert.  Psychiatric:        Mood and Affect: Mood normal.      ED Course/ Medical Decision Making/ A&P    Procedures Procedures   Medications Ordered in ED Medications  famotidine (PEPCID) tablet 20 mg (20 mg Oral Given 11/13/21 2019)  alum & mag hydroxide-simeth (MAALOX/MYLANTA) 200-200-20 MG/5ML suspension 30 mL (30 mLs Oral Given 11/13/21 2019)  acetaminophen (TYLENOL) tablet 1,000 mg (1,000 mg Oral Given 11/13/21 2019)   Medical Decision Making: Joshua Hood is a 76 y.o. male who presented  to the ED today with chest pain, detailed above.  Based on patient's comorbidities, patient has a heart score of 4.    Patient's presentation is complicated by their history of multiple comorbid medical problems including hypertension, diabetes.  Patient placed on continuous vitals and telemetry monitoring while in ED which was reviewed periodically.  Complete initial physical exam performed, notably the patient was hemodynamically stable in no acute distress.  He is currently asymptomatic.   Reviewed and confirmed nursing documentation for past medical history, family history, social history.    Initial Assessment: With the patient's presentation of  left-sided chest pain, most likely diagnosis is musculoskeletal chest pain versus GERD, although ACS remains on the differential. Other diagnoses were considered including (but not limited to) pulmonary embolism, community-acquired pneumonia, aortic dissection, pneumothorax, underlying bony abnormality, anemia. These are considered less likely due to history of present illness and physical exam findings.     Aortic Dissection also reconsidered but seems less likely based on the location, quality, onset, and severity of symptoms in this case. Patient also has a lack of underlying history of AD or TAA.  This is most consistent with an acute life/limb threatening illness complicated by underlying chronic conditions.   Initial Plan: Evaluate for ACS with delta troponin and EKG evaluated as below  Evaluate for dissection, bony abnormality, or pneumonia with chest x-ray and screening laboratory evaluation including CBC, BMP  Further evaluation for pulmonary embolism not indicated at this time based on patient's Wells score.  Further evaluation for Thoracic Aortic Dissection not indicated at this time based on patient's clinical history and PE findings.   Initial Study Results: EKG was reviewed independently. Rate, rhythm, axis, intervals all examined and without medically relevant abnormality. ST segments without concerns for elevations.    Laboratory  Initial troponin reassuring.  CBC and BMP without obvious metabolic or inflammatory abnormalities requiring further evaluation   Radiology  DG Chest 2 View  Result Date: 11/13/2021 CLINICAL DATA:  Chest pain EXAM: CHEST - 2 VIEW COMPARISON:  None Available. FINDINGS: Nodular opacity along the left lower lung at the overlap of two ribs, possibly osseous or reflecting a nipple shadow, as this is not evident on the lateral view Lungs are otherwise clear.  No pleural effusion or pneumothorax. The heart is normal in size. 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 IMPRESSION: Nodular opacity along the left lower lung is favored to be artifactual. Consider CT chest for confirmation, as clinically warranted. Otherwise, no evidence of acute cardiopulmonary disease. Electronically Signed   By: Julian Hy M.D.   On: 11/13/2021 19:45    Final Assessment and Plan: Approached patient about his lab results let him know the initial troponin was reassuring that we would be collecting a second.  He stated that he felt fine and would like to be discharged.  I informed patient of his elevated risk for cardiac disease given his multiple medical comorbidities and his baseline elevated risk of cardiac disease.  I have informed him that even with multiple negative troponins, there still remained a clinically significant risk for ACS within the next 30 days and reinforced that we only collected a single sample at this time.  He stated that he felt fine and that this has been bothering him for a while and he felt comfortable with outpatient follow-up with a cardiologist.  Given his understanding of the circumstances, I believe it is reasonable for patient to be discharged with his understanding of the risk of  missed ACS including morbidity or death. He stated he will return if his symptoms recur or worsen.  Disposition:  I have considered need for hospitalization, however, considering all of the above, I believe this patient is stable for discharge at this time.  Patient/family educated about specific return precautions for given chief complaint and symptoms.  Patient/family educated about follow-up with PCP and cardiology.     Patient/family expressed understanding of return precautions and need for follow-up. Patient spoken to regarding all imaging and laboratory results and appropriate follow up for these results. All education provided in verbal form with additional information in written form. Time was allowed for answering of  patient questions. Patient discharged.    Emergency Department Medication Summary:   Medications  famotidine (PEPCID) tablet 20 mg (20 mg Oral Given 11/13/21 2019)  alum & mag hydroxide-simeth (MAALOX/MYLANTA) 200-200-20 MG/5ML suspension 30 mL (30 mLs Oral Given 11/13/21 2019)  acetaminophen (TYLENOL) tablet 1,000 mg (1,000 mg Oral Given 11/13/21 2019)     Clinical Impression:  1. Chest pain, unspecified type      Discharge   Final Clinical Impression(s) / ED Diagnoses Final diagnoses:  Chest pain, unspecified type    Rx / DC Orders ED Discharge Orders     None         Tretha Sciara, MD 11/13/21 2022

## 2021-12-16 ENCOUNTER — Other Ambulatory Visit: Payer: Self-pay

## 2021-12-16 DIAGNOSIS — K219 Gastro-esophageal reflux disease without esophagitis: Secondary | ICD-10-CM | POA: Insufficient documentation

## 2021-12-23 ENCOUNTER — Ambulatory Visit: Payer: Medicare HMO | Attending: Cardiology | Admitting: Cardiology

## 2022-01-23 ENCOUNTER — Other Ambulatory Visit: Payer: Self-pay

## 2022-01-23 ENCOUNTER — Encounter (HOSPITAL_BASED_OUTPATIENT_CLINIC_OR_DEPARTMENT_OTHER): Payer: Self-pay | Admitting: *Deleted

## 2022-01-23 ENCOUNTER — Emergency Department (HOSPITAL_BASED_OUTPATIENT_CLINIC_OR_DEPARTMENT_OTHER)
Admission: EM | Admit: 2022-01-23 | Discharge: 2022-01-23 | Disposition: A | Discharge status: Home / Self Care | Payer: Medicare HMO | Attending: Emergency Medicine | Admitting: Emergency Medicine

## 2022-01-23 ENCOUNTER — Emergency Department (HOSPITAL_BASED_OUTPATIENT_CLINIC_OR_DEPARTMENT_OTHER): Payer: Medicare HMO

## 2022-01-23 DIAGNOSIS — Z7984 Long term (current) use of oral hypoglycemic drugs: Secondary | ICD-10-CM | POA: Insufficient documentation

## 2022-01-23 DIAGNOSIS — Z87891 Personal history of nicotine dependence: Secondary | ICD-10-CM | POA: Diagnosis not present

## 2022-01-23 DIAGNOSIS — Z79899 Other long term (current) drug therapy: Secondary | ICD-10-CM | POA: Insufficient documentation

## 2022-01-23 DIAGNOSIS — Z96659 Presence of unspecified artificial knee joint: Secondary | ICD-10-CM | POA: Insufficient documentation

## 2022-01-23 DIAGNOSIS — E119 Type 2 diabetes mellitus without complications: Secondary | ICD-10-CM | POA: Diagnosis not present

## 2022-01-23 DIAGNOSIS — R1032 Left lower quadrant pain: Secondary | ICD-10-CM | POA: Insufficient documentation

## 2022-01-23 DIAGNOSIS — Z8546 Personal history of malignant neoplasm of prostate: Secondary | ICD-10-CM | POA: Insufficient documentation

## 2022-01-23 DIAGNOSIS — I10 Essential (primary) hypertension: Secondary | ICD-10-CM | POA: Insufficient documentation

## 2022-01-23 MED ORDER — HYDROCODONE-ACETAMINOPHEN 5-325 MG PO TABS: 1.0000 | ORAL_TABLET | Freq: Four times a day (QID) | ORAL | 0 refills | Status: AC | PRN

## 2022-01-23 MED ORDER — HYDROCODONE-ACETAMINOPHEN 5-325 MG PO TABS
1.0000 | ORAL_TABLET | Freq: Once | ORAL | Status: AC
  Administered 2022-01-23: 1 via ORAL
  Filled 2022-01-23: qty 1

## 2022-01-23 NOTE — ED Provider Notes (Signed)
[facility] Provider Note: Georgena Spurling, MD, FACEP  CSN: [id number] MRN: [id number] ARRIVAL: 01/23/22 at [place]: MHFT2/MHFT2   CHIEF COMPLAINT  Hip Pain   HISTORY OF PRESENT ILLNESS  01/23/22 11:09 PM Joshua Hood is a 77 y.o. male with a history of chronic left mid groin pain.  He states that he has discussed a left hip replacement with an orthopedic surgeon at [facility] but that was never done.  The pain acutely worsened over the past 2 days.  He denies any relief trauma.  The pain radiates from his mid groin down the front of his leg.  It is worse with movement and it is making ambulation difficult.  He is having to walk with crutches.  He denies numbness.  He rates the pain as a 10 out of 10.    Past Medical History:  Diagnosis Date   Acquired anal stenosis 07/13/2017   Alcohol use 11/10/2013   Anterior uveitis 07/13/2016   Combined forms of age-related cataract of right eye 07/13/2016   Dry eye syndrome, bilateral 07/13/2016   Dysphagia 11/24/2010   Elevated PSA, less than 10 ng/ml 01/23/2012   Epididymitis 08/09/2012   Essential hypertension 11/24/2010   GERD (gastroesophageal reflux disease)    H/O malignant neoplasm of prostate 02/19/2019   Heart disease, unspecified 12/31/2010   Formatting of this note might be different from the original. Family hx   History of colonic polyps 11/24/2010   History of peptic ulcer disease 12/31/2010   History of total knee replacement 12/31/2010   Formatting of this note might be different from the original. rt   Hypertension    Hypertensive retinopathy of both eyes 07/13/2016   Idiopathic scoliosis and kyphoscoliosis 12/31/2010   Left hip pain 01/11/2012   Lumbosacral spondylosis without myelopathy 12/31/2010   Major depressive disorder, recurrent, in full remission ([place]) 12/31/2010   Formatting of this note might be different from the original. Suicide attempt 1977   Malignant neoplasm of prostate ([place])  02/06/2018   Non-traumatic rupture of Achilles tendon 12/31/2010   Osteoarthritis of knee 11/24/2010   Pityriasis versicolor 12/31/2010   Post-procedural erectile dysfunction 09/15/2021   Primary osteoarthritis of left hip 08/15/2016   Primary osteoarthritis of left shoulder 12/31/2010   Formatting of this note might be different from the original. CT arthrogram (08-28-2016)-severe glenohumeral arthritis with no significant medial or posterior wear and intact rotator cuff.   Primary osteoarthritis of right shoulder 08/31/2016   Prostate mass 01/11/2012   Prostatitis 08/09/2012   Proteinuria 06/14/2021   Pseudophakia of left eye 07/13/2016   Stricture and stenosis of esophagus 12/31/2010   Formatting of this note might be different from the original. Hx of   Syncope and collapse 08/05/2018   Formatting of this note might be different from the original. Added automatically from request for surgery [id number]   Testicular pain, left 08/09/2012   Tobacco chew use 02/19/2019   Uncontrolled type 2 diabetes mellitus with hyperglycemia ([place]) 02/19/2019    Past Surgical History:  Procedure Laterality Date   JOINT REPLACEMENT      History reviewed. No pertinent family history.  Social History   Tobacco Use   Smoking status: Former    Types: Cigarettes  Substance Use Topics   Alcohol use: Yes    Comment: rare   Drug use: Yes    Types: Marijuana    Prior to Admission medications   Medication Sig Start Date End Date Taking? Authorizing Provider  amLODipine (NORVASC)  10 MG tablet Take 10 mg by mouth daily. 10/25/21   [provider]  atorvastatin (LIPITOR) 40 MG tablet Take 40 mg by mouth daily. 09/01/21   [provider]  famotidine (PEPCID) 40 MG tablet Take 40 mg by mouth 2 (two) times daily. 09/05/21   [provider]  hydrochlorothiazide (HYDRODIURIL) 25 MG tablet Take 25 mg by mouth daily.    [provider]  ibuprofen (ADVIL,MOTRIN) 200 MG tablet  Take 200-400 mg by mouth every 8 (eight) hours as needed for mild pain.    [provider]  lisinopril (PRINIVIL,ZESTRIL) 5 MG tablet Take 5 mg by mouth daily.    [provider]  losartan (COZAAR) 25 MG tablet Take 25 mg by mouth daily. 10/25/21   [provider]  metFORMIN (GLUCOPHAGE) 500 MG tablet Take 500 mg by mouth 2 (two) times daily with a meal.    [provider]  omeprazole (PRILOSEC) 20 MG capsule Take 20 mg by mouth daily.    [provider]  sildenafil (VIAGRA) 100 MG tablet Take 100 mg by mouth daily as needed for erectile dysfunction. 11/23/21   [provider]    Allergies Lisinopril   REVIEW OF SYSTEMS  Negative except as noted here or in the History of Present Illness.   PHYSICAL EXAMINATION  Initial Vital Signs Blood pressure 129/78, pulse 85, temperature 98.5 F (36.9 C), temperature source Oral, resp. rate 20, height '5\' 5"'$  (1.651 m), weight 86.2 kg, SpO2 97 %.  Examination General: Well-developed, well-nourished male in no acute distress; appearance consistent with age of record HENT: normocephalic; atraumatic Eyes: Normal appearance Neck: supple Heart: regular rate and rhythm Lungs: clear to auscultation bilaterally Abdomen: soft; nondistended; nontender; bowel sounds present Extremities: No deformity; anterior groin pain on movement of left hip Neurologic: Awake, alert and oriented; motor function intact in all extremities and symmetric; sensation intact and symmetric in lower extremities; no facial droop Skin: Warm and dry Psychiatric: Normal mood and affect   RESULTS  Summary of this visit's results, reviewed and interpreted by myself:   EKG Interpretation  Date/Time:    Ventricular Rate:    PR Interval:    QRS Duration:   QT Interval:    QTC Calculation:   R Axis:     Text Interpretation:         Laboratory Studies: No results found for this or any previous visit (from the past 24  hour(s)). Imaging Studies: DG Hip Unilat With Pelvis 2-3 Views Left  Result Date: 01/23/2022 CLINICAL DATA:  Left hip pain, no known injury, initial encounter EXAM: DG HIP (WITH OR WITHOUT PELVIS) 2-3V LEFT COMPARISON:  None Available. FINDINGS: Pelvic ring is intact. Degenerative changes hip joints are noted bilaterally. No acute fracture or dislocation is noted. No gross soft tissue abnormality is seen. IMPRESSION: No acute abnormality noted. Electronically Signed   By: Inez Catalina M.D.   On: 01/23/2022 19:28    ED COURSE and MDM  Nursing notes, initial and subsequent vitals signs, including pulse oximetry, reviewed and interpreted by myself.  Vitals:   01/23/22 1855 01/23/22 1856  BP:  129/78  Pulse:  85  Resp:  20  Temp:  98.5 F (36.9 C)  TempSrc:  Oral  SpO2:  97%  Weight: 86.2 kg   Height: '5\' 5"'$  (1.651 m)    Medications  HYDROcodone-acetaminophen (NORCO/VICODIN) 5-325 MG per tablet 1 tablet (has no administration in time range)   The location of the patient's pain  is more consistent with femoral nerve origin rather than degenerative changes in the hip itself.  There are degenerative changes seen in both hips but I would expect more pain within the hip joint itself if this was the cause.  He denies injury so I doubt a groin pull.  We will treat his pain and he will follow-up with his orthopedist at [facility]  Procedures   ED DIAGNOSES     ICD-10-CM   1. Left groin pain  R10.32          Shanon Rosser, MD 01/23/22 2317

## 2022-01-23 NOTE — ED Triage Notes (Signed)
Pt is here for left hip pain.  Pt reports having chronic issues with this hip but it has never been this painful.  Pt reports pain has been increasing since Saturday, not related to any recent trauma, pain radiating down leg.

## 2023-08-25 ENCOUNTER — Encounter (HOSPITAL_COMMUNITY): Payer: Self-pay

## 2023-08-25 ENCOUNTER — Emergency Department (HOSPITAL_COMMUNITY)

## 2023-08-25 ENCOUNTER — Other Ambulatory Visit: Payer: Self-pay

## 2023-08-25 ENCOUNTER — Emergency Department (HOSPITAL_COMMUNITY)
Admission: EM | Admit: 2023-08-25 | Discharge: 2023-08-26 | Disposition: A | Discharge status: Home / Self Care | Attending: Emergency Medicine | Admitting: Emergency Medicine

## 2023-08-25 DIAGNOSIS — R0602 Shortness of breath: Secondary | ICD-10-CM | POA: Insufficient documentation

## 2023-08-25 DIAGNOSIS — Z79899 Other long term (current) drug therapy: Secondary | ICD-10-CM | POA: Insufficient documentation

## 2023-08-25 DIAGNOSIS — I10 Essential (primary) hypertension: Secondary | ICD-10-CM | POA: Insufficient documentation

## 2023-08-25 DIAGNOSIS — R0789 Other chest pain: Secondary | ICD-10-CM | POA: Insufficient documentation

## 2023-08-25 DIAGNOSIS — Z7984 Long term (current) use of oral hypoglycemic drugs: Secondary | ICD-10-CM | POA: Insufficient documentation

## 2023-08-25 DIAGNOSIS — E119 Type 2 diabetes mellitus without complications: Secondary | ICD-10-CM | POA: Insufficient documentation

## 2023-08-25 DIAGNOSIS — Z8546 Personal history of malignant neoplasm of prostate: Secondary | ICD-10-CM | POA: Insufficient documentation

## 2023-08-25 LAB — CBC
HCT: 41.3 % (ref 39.0–52.0)
Hemoglobin: 13.6 g/dL (ref 13.0–17.0)
MCH: 31.3 pg (ref 26.0–34.0)
MCHC: 32.9 g/dL (ref 30.0–36.0)
MCV: 94.9 fL (ref 80.0–100.0)
Platelets: 208 K/uL (ref 150–400)
RBC: 4.35 MIL/uL (ref 4.22–5.81)
RDW: 12.3 % (ref 11.5–15.5)
WBC: 6.4 K/uL (ref 4.0–10.5)
nRBC: 0 % (ref 0.0–0.2)

## 2023-08-25 LAB — HEPATIC FUNCTION PANEL
ALT: 12 U/L (ref 0–44)
AST: 21 U/L (ref 15–41)
Albumin: 3.2 g/dL — ABNORMAL LOW (ref 3.5–5.0)
Alkaline Phosphatase: 48 U/L (ref 38–126)
Bilirubin, Direct: 0.2 mg/dL (ref 0.0–0.2)
Indirect Bilirubin: 0.4 mg/dL (ref 0.3–0.9)
Total Bilirubin: 0.6 mg/dL (ref 0.0–1.2)
Total Protein: 6 g/dL — ABNORMAL LOW (ref 6.5–8.1)

## 2023-08-25 LAB — BASIC METABOLIC PANEL WITH GFR
Anion gap: 8 (ref 5–15)
BUN: 13 mg/dL (ref 8–23)
CO2: 23 mmol/L (ref 22–32)
Calcium: 8.2 mg/dL — ABNORMAL LOW (ref 8.9–10.3)
Chloride: 109 mmol/L (ref 98–111)
Creatinine, Ser: 1.18 mg/dL (ref 0.61–1.24)
GFR, Estimated: >60 mL/min (ref >60)
Glucose, Bld: 155 mg/dL — ABNORMAL HIGH (ref 70–99)
Potassium: 3.9 mmol/L (ref 3.5–5.1)
Sodium: 140 mmol/L (ref 135–145)

## 2023-08-25 LAB — LIPASE, BLOOD: Lipase: 31 U/L (ref 11–51)

## 2023-08-25 LAB — TROPONIN I (HIGH SENSITIVITY)
Troponin I (High Sensitivity): 6 ng/L (ref <18)
Troponin I (High Sensitivity): 6 ng/L (ref <18)

## 2023-08-25 MED ORDER — ALUM & MAG HYDROXIDE-SIMETH 200-200-20 MG/5ML PO SUSP
30.0000 mL | Freq: Once | ORAL | Status: AC
  Administered 2023-08-25: 30 mL via ORAL
  Filled 2023-08-25: qty 30

## 2023-08-25 NOTE — ED Provider Notes (Signed)
 [facility] AT [facility] Provider Note   CSN: [id number] Arrival date & time: 08/25/23  8046     Patient presents with: Chest Pain   Joshua Hood is a 78 y.o. male.   Pt is a 78 yo male with pmhx significant for HTN, GERD, DM2, OA, prostate cancer, PUD, MDD, esophageal dysmotility, esophageal stricture, syncope, and aortic valve stenosis.  Pt developed some cp earlier today which felt like a tightness.  EMS gave him asa which helped.  Pt has a little tightness left in his chest, but no significant pain.  No sob now, but he had some earlier.  No syncope.  Pt has an implantable loop recorder in place which is no longer functional.  It is planned for removal on 8/18.  Pt is also scheduled to have an EGD with dilation soon.       Prior to Admission medications   Medication Sig Start Date End Date Taking? Authorizing Provider  amLODipine (NORVASC) 10 MG tablet Take 10 mg by mouth daily. 10/25/21   [provider]  atorvastatin (LIPITOR) 40 MG tablet Take 40 mg by mouth daily. 09/01/21   [provider]  famotidine  (PEPCID ) 40 MG tablet Take 40 mg by mouth 2 (two) times daily. 09/05/21   [provider]  hydrochlorothiazide (HYDRODIURIL) 25 MG tablet Take 25 mg by mouth daily.    [provider]  HYDROcodone -acetaminophen  (NORCO) 5-325 MG tablet Take 1 tablet by mouth every 6 (six) hours as needed for severe pain. 01/23/22   Molpus, John, MD  ibuprofen (ADVIL,MOTRIN) 200 MG tablet Take 200-400 mg by mouth every 8 (eight) hours as needed for mild pain.    [provider]  lisinopril (PRINIVIL,ZESTRIL) 5 MG tablet Take 5 mg by mouth daily.    [provider]  losartan (COZAAR) 25 MG tablet Take 25 mg by mouth daily. 10/25/21   [provider]  metFORMIN (GLUCOPHAGE) 500 MG tablet Take 500 mg by mouth 2 (two) times daily with a meal.    [provider]  omeprazole (PRILOSEC) 20 MG capsule  Take 20 mg by mouth daily.    [provider]  sildenafil (VIAGRA) 100 MG tablet Take 100 mg by mouth daily as needed for erectile dysfunction. 11/23/21   [provider]    Allergies: Lisinopril    Review of Systems  Respiratory:  Positive for shortness of breath.   Cardiovascular:  Positive for chest pain.  All other systems reviewed and are negative.   Updated Vital Signs BP 127/84   Pulse 96   Temp 98.3 F (36.8 C)   Resp (!) 21   Ht 5' 5 (1.651 m)   Wt 86.6 kg   SpO2 94%   BMI 31.78 kg/m   Physical Exam Vitals and nursing note reviewed.  Constitutional:      Appearance: He is well-developed.  HENT:     Head: Normocephalic and atraumatic.  Eyes:     Extraocular Movements: Extraocular movements intact.     Pupils: Pupils are equal, round, and reactive to light.  Cardiovascular:     Rate and Rhythm: Normal rate and regular rhythm.     Heart sounds: Murmur heard.     Systolic murmur is present with a grade of 2/6.  Pulmonary:     Effort: Pulmonary effort is normal.     Breath sounds: Normal breath sounds.  Abdominal:     General: Bowel sounds are normal.  Palpations: Abdomen is soft.  Musculoskeletal:        General: Normal range of motion.     Cervical back: Normal range of motion and neck supple.  Skin:    General: Skin is warm.     Capillary Refill: Capillary refill takes less than 2 seconds.  Neurological:     General: No focal deficit present.     Mental Status: He is alert and oriented to person, place, and time.  Psychiatric:        Mood and Affect: Mood normal.        Behavior: Behavior normal.     (all labs ordered are listed, but only abnormal results are displayed) Labs Reviewed  BASIC METABOLIC PANEL WITH GFR - Abnormal; Notable for the following components:      Result Value   Glucose, Bld 155 (*)    Calcium 8.2 (*)    All other components within normal limits  HEPATIC FUNCTION PANEL - Abnormal; Notable for the  following components:   Total Protein 6.0 (*)    Albumin 3.2 (*)    All other components within normal limits  CBC  LIPASE, BLOOD  TROPONIN I (HIGH SENSITIVITY)  TROPONIN I (HIGH SENSITIVITY)    EKG: EKG Interpretation Date/Time:  Saturday August 25 2023 20:02:21 EDT Ventricular Rate:  96 PR Interval:  165 QRS Duration:  80 QT Interval:  336 QTC Calculation: 425 R Axis:   42  Text Interpretation: Sinus rhythm No significant change since last tracing Confirmed by Dean Clarity [phone]) on 08/25/2023 8:45:10 PM  Radiology: [facility] Chest Portable 1 View Result Date: 08/25/2023 CLINICAL DATA:  Chest pain EXAM: PORTABLE CHEST 1 VIEW COMPARISON:  11/13/2021 FINDINGS: Bilateral shoulder replacements. Low lung volumes with eventration of right diaphragm. Mild atelectasis at the bases. No consolidation or effusion. Stable cardiomediastinal silhouette. Electronic device over left chest. IMPRESSION: Low lung volumes with mild atelectasis at the bases. Electronically Signed   By: Luke Bun M.D.   On: 08/25/2023 20:53     Procedures   Medications Ordered in the ED  alum & mag hydroxide-simeth (MAALOX/MYLANTA) 200-200-20 MG/5ML suspension 30 mL (30 mLs Oral Given 08/25/23 2045)                                    Medical Decision Making Amount and/or Complexity of Data Reviewed Labs: ordered. Radiology: ordered.  Risk OTC drugs.   This patient presents to the ED for concern of cp, this involves an extensive number of treatment options, and is a complaint that carries with it a high risk of complications and morbidity.  The differential diagnosis includes cardiac, pulm, gi   Co morbidities that complicate the patient evaluation  HTN, GERD, DM2, OA, prostate cancer, PUD, MDD, esophageal dysmotility, esophageal stricture, syncope, and aortic valve stenosis   Additional history obtained:  Additional history obtained from epic chart review External records from outside source  obtained and reviewed including EMS report/family   Lab Tests:  I Ordered, and personally interpreted labs.  The pertinent results include:  cbc nl, bmp nl, lfts nl, trop nl times 2   Imaging Studies ordered:  I ordered imaging studies including cxr  I independently visualized and interpreted imaging which showed Low lung volumes with mild atelectasis at the bases.  I agree with the radiologist interpretation   Cardiac Monitoring:  The patient was maintained on a cardiac monitor.  I personally  viewed and interpreted the cardiac monitored which showed an underlying rhythm of: nsr   Medicines ordered and prescription drug management:  I ordered medication including gi cocktail  for sx  Reevaluation of the patient after these medicines showed that the patient improved I have reviewed the patients home medicines and have made adjustments as needed   Problem List / ED Course:  Cp:  atypical.  Pt is better after gi cocktail.  He has a hx of significant esophagus and GERD issues.  He is told to return if worse.  F/u with pcp/gi.   Reevaluation:  After the interventions noted above, I reevaluated the patient and found that they have :improved   Social Determinants of Health:  Lives at home   Dispostion:  After consideration of the diagnostic results and the patients response to treatment, I feel that the patent would benefit from discharge with outpatient f/u.       Final diagnoses:  Atypical chest pain    ED Discharge Orders     None          Dean Clarity, MD 08/25/23 2336

## 2023-08-25 NOTE — ED Triage Notes (Signed)
 Pt bib [facility] coming from home with sudden onset of left sided sharp chest pain, non-radiating. 324 aspirin given by fire. On EMS arrival, patient 3/10 pain and more of an aching pain.  Pt short of breath with exertion. Pt denies chest pain at time of triage but endorses shortness of breath. GCS 15.   EMS: 20g left forearm 500 cc  90s hr 102/84  100% ra 12 lead unremarkable
